# Patient Record
Sex: Female | Born: 1995 | Race: Black or African American | Hispanic: No | Marital: Single
Health system: Southern US, Community
[De-identification: ages and names within clinical notes are randomized; demographics above are authoritative.]

## PROBLEM LIST (undated history)

## (undated) DIAGNOSIS — J45909 Unspecified asthma, uncomplicated: Secondary | ICD-10-CM

---

## 2016-02-02 ENCOUNTER — Encounter (HOSPITAL_COMMUNITY): Payer: Self-pay | Admitting: Emergency Medicine

## 2016-02-02 ENCOUNTER — Emergency Department (HOSPITAL_COMMUNITY)
Admission: EM | Admit: 2016-02-02 | Discharge: 2016-02-02 | Disposition: A | Discharge status: Home / Self Care | Payer: Self-pay | Attending: Emergency Medicine | Admitting: Emergency Medicine

## 2016-02-02 DIAGNOSIS — Y999 Unspecified external cause status: Secondary | ICD-10-CM | POA: Insufficient documentation

## 2016-02-02 DIAGNOSIS — M549 Dorsalgia, unspecified: Secondary | ICD-10-CM | POA: Insufficient documentation

## 2016-02-02 DIAGNOSIS — Y939 Activity, unspecified: Secondary | ICD-10-CM | POA: Insufficient documentation

## 2016-02-02 DIAGNOSIS — M7918 Myalgia, other site: Secondary | ICD-10-CM

## 2016-02-02 DIAGNOSIS — Y92410 Unspecified street and highway as the place of occurrence of the external cause: Secondary | ICD-10-CM | POA: Insufficient documentation

## 2016-02-02 DIAGNOSIS — J45909 Unspecified asthma, uncomplicated: Secondary | ICD-10-CM | POA: Insufficient documentation

## 2016-02-02 HISTORY — DX: Unspecified asthma, uncomplicated: J45.909

## 2016-02-02 MED ORDER — NAPROXEN 500 MG PO TABS: 500.0000 mg | ORAL_TABLET | Freq: Two times a day (BID) | ORAL | 0 refills | Status: AC

## 2016-02-02 MED ORDER — NAPROXEN 500 MG PO TABS
500.0000 mg | ORAL_TABLET | Freq: Once | ORAL | Status: AC
  Administered 2016-02-02: 500 mg via ORAL
  Filled 2016-02-02: qty 1

## 2016-02-02 MED ORDER — CYCLOBENZAPRINE HCL 10 MG PO TABS: 10.0000 mg | ORAL_TABLET | Freq: Two times a day (BID) | ORAL | 0 refills | Status: AC | PRN

## 2016-02-02 MED ORDER — CYCLOBENZAPRINE HCL 10 MG PO TABS
10.0000 mg | ORAL_TABLET | Freq: Once | ORAL | Status: AC
  Administered 2016-02-02: 10 mg via ORAL
  Filled 2016-02-02: qty 1

## 2016-02-02 NOTE — ED Notes (Signed)
No bruising noted to chest or abdomen on assessment.
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
## 2016-02-02 NOTE — ED Notes (Signed)
Pt ambulatory and independent at discharge.  Verbalized understanding of discharge instructions 

## 2016-02-23 ENCOUNTER — Ambulatory Visit
Admission: RE | Admit: 2016-02-23 | Discharge: 2016-02-23 | Disposition: A | Discharge status: Home / Self Care | Payer: PRIVATE HEALTH INSURANCE | Source: Ambulatory Visit | Attending: Chiropractor | Admitting: Chiropractor

## 2016-02-23 ENCOUNTER — Other Ambulatory Visit: Payer: Self-pay | Admitting: Chiropractor

## 2016-02-23 DIAGNOSIS — M545 Low back pain: Secondary | ICD-10-CM

## 2016-10-21 ENCOUNTER — Other Ambulatory Visit: Payer: Self-pay | Admitting: Obstetrics
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
